# Patient Record
Sex: Female | Born: 1970 | Race: White | Hispanic: No | Marital: Single | Smoking: Current every day smoker
Health system: Southern US, Community
[De-identification: ages and names within clinical notes are randomized; demographics above are authoritative.]

## PROBLEM LIST (undated history)

## (undated) DIAGNOSIS — I10 Essential (primary) hypertension: Secondary | ICD-10-CM

---

## 2005-05-18 ENCOUNTER — Ambulatory Visit: Payer: Self-pay | Admitting: Unknown Physician Specialty

## 2009-02-07 ENCOUNTER — Ambulatory Visit: Payer: Self-pay | Admitting: General Practice

## 2010-07-13 ENCOUNTER — Ambulatory Visit: Payer: Self-pay | Admitting: Unknown Physician Specialty

## 2010-10-12 ENCOUNTER — Ambulatory Visit: Payer: Self-pay | Admitting: Family Medicine

## 2010-10-19 ENCOUNTER — Ambulatory Visit: Payer: Self-pay | Admitting: Family Medicine

## 2011-03-16 ENCOUNTER — Ambulatory Visit: Payer: Self-pay

## 2011-10-25 ENCOUNTER — Ambulatory Visit: Payer: Self-pay | Admitting: Obstetrics and Gynecology

## 2011-12-10 ENCOUNTER — Ambulatory Visit: Payer: Self-pay | Admitting: Medical

## 2012-10-05 ENCOUNTER — Ambulatory Visit: Payer: Self-pay | Admitting: Emergency Medicine

## 2012-10-29 ENCOUNTER — Ambulatory Visit: Payer: Self-pay | Admitting: Obstetrics and Gynecology

## 2012-10-30 ENCOUNTER — Ambulatory Visit: Payer: Self-pay | Admitting: Obstetrics and Gynecology

## 2013-11-06 ENCOUNTER — Ambulatory Visit: Payer: Self-pay | Admitting: Nurse Practitioner

## 2014-10-07 ENCOUNTER — Ambulatory Visit: Payer: Self-pay | Admitting: Surgery

## 2015-02-04 ENCOUNTER — Other Ambulatory Visit: Payer: Self-pay | Admitting: Nurse Practitioner

## 2015-02-04 DIAGNOSIS — Z1231 Encounter for screening mammogram for malignant neoplasm of breast: Secondary | ICD-10-CM

## 2015-02-09 ENCOUNTER — Ambulatory Visit
Admission: RE | Admit: 2015-02-09 | Discharge: 2015-02-09 | Disposition: A | Discharge status: Home / Self Care | Payer: Commercial Managed Care - PPO | Source: Ambulatory Visit | Attending: Nurse Practitioner | Admitting: Nurse Practitioner

## 2015-02-09 DIAGNOSIS — Z1231 Encounter for screening mammogram for malignant neoplasm of breast: Secondary | ICD-10-CM | POA: Diagnosis present

## 2016-02-16 ENCOUNTER — Other Ambulatory Visit: Payer: Self-pay | Admitting: Nurse Practitioner

## 2016-02-17 ENCOUNTER — Other Ambulatory Visit: Payer: Self-pay | Admitting: Nurse Practitioner

## 2016-02-20 ENCOUNTER — Other Ambulatory Visit: Payer: Self-pay | Admitting: Nurse Practitioner

## 2016-02-20 DIAGNOSIS — Z1239 Encounter for other screening for malignant neoplasm of breast: Secondary | ICD-10-CM

## 2016-02-20 DIAGNOSIS — N63 Unspecified lump in unspecified breast: Secondary | ICD-10-CM

## 2016-03-10 ENCOUNTER — Ambulatory Visit
Admission: EM | Admit: 2016-03-10 | Discharge: 2016-03-10 | Disposition: A | Discharge status: Home / Self Care | Payer: Commercial Managed Care - PPO | Attending: Family Medicine | Admitting: Family Medicine

## 2016-03-10 ENCOUNTER — Encounter: Payer: Self-pay | Admitting: Emergency Medicine

## 2016-03-10 DIAGNOSIS — J0100 Acute maxillary sinusitis, unspecified: Secondary | ICD-10-CM

## 2016-03-10 HISTORY — DX: Essential (primary) hypertension: I10

## 2016-03-10 MED ORDER — AMOXICILLIN-POT CLAVULANATE 875-125 MG PO TABS: 1.0000 | ORAL_TABLET | Freq: Two times a day (BID) | ORAL | 0 refills | Status: AC

## 2016-03-10 NOTE — ED Provider Notes (Signed)
[facility] ____________________________________________  Time seen: Approximately 10:43 AM  I have reviewed the triage vital signs and the nursing notes.   HISTORY  Chief Complaint Ear Fullness and Nasal Congestion   HPI Terri Jackson is a 45 y.o. female  presenting for the complaint of 1.5 weeks of runny nose, nasal congestion and sinus pressure. Patient reports the last 2 days both for ears have felt congested and full of fluid. Denies any tinnitus or ear drainage. Denies any fevers. Patient reports symptoms initially started out like she is developing a cold but have progressed. Patient reports that she is having continued pressure around her cheek and forehead. Reports symptoms have been unresolved with over-the-counter cough and congestion medications. Reports frequently blowing nose and getting very thick greenish nasal drainage, as well as postnasal drainage. States occasional cough.  Denies fevers, chest pain or shortness breath, abdominal pain, dysuria, neck pain, back pain, extremity pain or shortness on. Reports continues to eat and drink well. Denies recent sickness or recent antibiotic use.  Patient's last menstrual period was 02/25/2016 (approximate). Denies pregnancy. Terri Jackson, Terri KATHRYN, NP: Pcp    Past Medical History:  Diagnosis Date  . Hypertension     There are no active problems to display for this patient.   History reviewed. No pertinent surgical history.  Current Outpatient Rx  . Order #: [id number] Class: Historical Med  . Order #: [id number] Class: Historical Med  . Order #: [id number] Class: Historical Med  . Order #: [id number] Class: Normal    No current facility-administered medications for this encounter.   Current Outpatient Prescriptions:  .  amLODipine (NORVASC) 5 MG tablet, Take 5 mg by mouth daily., Disp: , Rfl:  .  norethindrone-ethinyl estradiol 1/35 (ORTHO-NOVUM, NORTREL,CYCLAFEM) tablet, Take 1 tablet by mouth daily., Disp:  , Rfl:  .  sertraline (ZOLOFT) 100 MG tablet, Take 100 mg by mouth daily., Disp: , Rfl:  .  amoxicillin-clavulanate (AUGMENTIN) 875-125 MG tablet, Take 1 tablet by mouth every 12 (twelve) hours., Disp: 20 tablet, Rfl: 0  Allergies Patient has no known allergies.  Family History  Problem Relation Age of Onset  . Breast cancer Mother [id number]  . Breast cancer Sister 6845    Social History Social History  Substance Use Topics  . Smoking status: Current Every Day Smoker  . Smokeless tobacco: Never Used  . Alcohol use Yes    Review of Systems Constitutional: No fever/chills Eyes: No visual changes. ENT: No sore throat.As above.  Cardiovascular: Denies chest pain. Respiratory: Denies shortness of breath. Gastrointestinal: No abdominal pain.  No nausea, no vomiting.  No diarrhea.  No constipation. Genitourinary: Negative for dysuria. Musculoskeletal: Negative for back pain. Skin: Negative for rash. Neurological: Negative for headaches, focal weakness or numbness.  10-point ROS otherwise negative.  ____________________________________________   PHYSICAL EXAM:  VITAL SIGNS: ED Triage Vitals  Enc Vitals Group     BP 03/10/16 1016 (!) 148/96     Pulse Rate 03/10/16 1016 97     Resp 03/10/16 1016 16     Temp 03/10/16 1016 99.4 F (37.4 C)     Temp Source 03/10/16 1016 Oral     SpO2 03/10/16 1016 99 %     Weight 03/10/16 1016 155 lb (70.3 kg)     Height 03/10/16 1016 5\' 3"  (1.6 m)     Head Circumference --      Peak Flow --      Pain Score 03/10/16 1020 5     Pain Loc --  Pain Edu? --      Excl. in GC? --   Reports forgot to take home BP med this am, and will take once home.   Constitutional: Alert and oriented. Well appearing and in no acute distress. Eyes: Conjunctivae are normal. PERRL. EOMI. Head: Atraumatic.Mild tenderness to palpation bilateral frontal and maxillary sinuses. No swelling. No erythema.   Ears: no erythema, normal TMs bilaterally.   Nose: nasal  congestion with bilateral nasal turbinate erythema and edema.    Mouth/Throat: Mucous membranes are moist.  Oropharynx non-erythematous.No tonsillar swelling or exudate.  Neck: No stridor.  No cervical spine tenderness to palpation. Hematological/Lymphatic/Immunilogical: No cervical lymphadenopathy. Cardiovascular: Normal rate, regular rhythm. Grossly normal heart sounds.  Good peripheral circulation. Respiratory: Normal respiratory effort.  No retractions. Lungs CTAB. No wheezes, rales or rhonchi. Good air movement.  Gastrointestinal: Soft and nontender. No distention.  Musculoskeletal: No lower or upper extremity tenderness nor edema. No cervical, thoracic or lumbar tenderness to palpation.  Neurologic:  Normal speech and language. No gross focal neurologic deficits are appreciated. No gait instability. Skin:  Skin is warm, dry and intact. No rash noted. Psychiatric: Mood and affect are normal. Speech and behavior are normal.  ___________________________________________   LABS (all labs ordered are listed, but only abnormal results are displayed)  Labs Reviewed - No data to display ____________________________________________   PROCEDURES Procedures    INITIAL IMPRESSION / ASSESSMENT AND PLAN / ED COURSE  Pertinent labs & imaging results that were available during my care of the patient were reviewed by me and considered in my medical decision making (see chart for details).  Well-appearing patient. No acute distress. Suspect frontal and maxillary sinusitis. Will treat patient with oral Augmentin. Encouraged supportive care. Encourage rest, fluids and PCP follow-up as needed. Discussed indication, risks and benefits of medications with patient.  Discussed follow up with Primary care physician this week. Discussed follow up and return parameters including no resolution or any worsening concerns. Patient verbalized understanding and agreed to plan.    ____________________________________________   FINAL CLINICAL IMPRESSION(S) / ED DIAGNOSES  Final diagnoses:  Acute maxillary sinusitis, recurrence not specified     Discharge Medication List as of 03/10/2016 10:37 AM    START taking these medications   Details  amoxicillin-clavulanate (AUGMENTIN) 875-125 MG tablet Take 1 tablet by mouth every 12 (twelve) hours., Starting Sat 03/10/2016, Normal        Note: This dictation was prepared with Dragon dictation along with smaller phrase technology. Any transcriptional errors that result from this process are unintentional.    Clinical Course       Renford DillsLindsey Jamyrah Saur, NP 03/10/16 1111    Renford DillsLindsey Tige Meas, NP 03/10/16 1112

## 2016-03-10 NOTE — Discharge Instructions (Signed)
Take medication as prescribed. Rest. Drink plenty of fluids.  ° °Follow up with your primary care physician this week as needed. Return to Urgent care for new or worsening concerns.  ° °

## 2016-03-10 NOTE — ED Triage Notes (Signed)
Patient c/o cold symptoms for over a week. Patient c/o fullness in left ear that started over 2 days ago. Patient denies fevers.

## 2016-03-13 ENCOUNTER — Telehealth: Payer: Self-pay

## 2016-03-13 NOTE — Telephone Encounter (Signed)
Tired to do a follow up call for patient improvement, no answer or voicemail as an option. Will try again later.

## 2016-03-17 ENCOUNTER — Telehealth: Payer: Self-pay

## 2016-03-21 ENCOUNTER — Ambulatory Visit
Admission: RE | Admit: 2016-03-21 | Discharge: 2016-03-21 | Disposition: A | Discharge status: Home / Self Care | Payer: Commercial Managed Care - PPO | Source: Ambulatory Visit | Attending: Nurse Practitioner | Admitting: Nurse Practitioner

## 2016-03-21 DIAGNOSIS — Z1231 Encounter for screening mammogram for malignant neoplasm of breast: Secondary | ICD-10-CM | POA: Insufficient documentation

## 2016-03-21 DIAGNOSIS — N63 Unspecified lump in unspecified breast: Secondary | ICD-10-CM | POA: Insufficient documentation

## 2016-03-21 DIAGNOSIS — Z1239 Encounter for other screening for malignant neoplasm of breast: Secondary | ICD-10-CM

## 2017-02-15 ENCOUNTER — Other Ambulatory Visit: Payer: Self-pay | Admitting: Nurse Practitioner

## 2017-02-15 DIAGNOSIS — Z1231 Encounter for screening mammogram for malignant neoplasm of breast: Secondary | ICD-10-CM

## 2017-03-25 ENCOUNTER — Ambulatory Visit
Admission: RE | Admit: 2017-03-25 | Discharge: 2017-03-25 | Disposition: A | Discharge status: Home / Self Care | Payer: Commercial Managed Care - PPO | Source: Ambulatory Visit | Attending: Nurse Practitioner | Admitting: Nurse Practitioner

## 2017-03-25 DIAGNOSIS — Z1231 Encounter for screening mammogram for malignant neoplasm of breast: Secondary | ICD-10-CM | POA: Diagnosis not present

## 2018-02-17 ENCOUNTER — Other Ambulatory Visit: Payer: Self-pay | Admitting: Nurse Practitioner

## 2018-02-17 DIAGNOSIS — Z1231 Encounter for screening mammogram for malignant neoplasm of breast: Secondary | ICD-10-CM

## 2018-03-26 ENCOUNTER — Ambulatory Visit
Admission: RE | Admit: 2018-03-26 | Discharge: 2018-03-26 | Disposition: A | Discharge status: Home / Self Care | Payer: PRIVATE HEALTH INSURANCE | Source: Ambulatory Visit | Attending: Nurse Practitioner | Admitting: Nurse Practitioner

## 2018-03-26 ENCOUNTER — Encounter (INDEPENDENT_AMBULATORY_CARE_PROVIDER_SITE_OTHER): Payer: Self-pay

## 2018-03-26 DIAGNOSIS — Z1231 Encounter for screening mammogram for malignant neoplasm of breast: Secondary | ICD-10-CM | POA: Diagnosis not present

## 2019-02-23 ENCOUNTER — Other Ambulatory Visit: Payer: Self-pay | Admitting: Nurse Practitioner

## 2019-02-23 DIAGNOSIS — Z1231 Encounter for screening mammogram for malignant neoplasm of breast: Secondary | ICD-10-CM

## 2019-03-30 ENCOUNTER — Ambulatory Visit
Admission: RE | Admit: 2019-03-30 | Discharge: 2019-03-30 | Disposition: A | Discharge status: Home / Self Care | Payer: Commercial Managed Care - PPO | Source: Ambulatory Visit | Attending: Nurse Practitioner | Admitting: Nurse Practitioner

## 2019-03-30 ENCOUNTER — Other Ambulatory Visit: Payer: Self-pay

## 2019-03-30 DIAGNOSIS — Z1231 Encounter for screening mammogram for malignant neoplasm of breast: Secondary | ICD-10-CM | POA: Insufficient documentation

## 2020-02-25 ENCOUNTER — Other Ambulatory Visit: Payer: Self-pay | Admitting: Nurse Practitioner

## 2020-02-25 DIAGNOSIS — Z1231 Encounter for screening mammogram for malignant neoplasm of breast: Secondary | ICD-10-CM

## 2020-03-30 ENCOUNTER — Other Ambulatory Visit: Payer: Self-pay

## 2020-03-30 ENCOUNTER — Ambulatory Visit
Admission: RE | Admit: 2020-03-30 | Discharge: 2020-03-30 | Disposition: A | Discharge status: Home / Self Care | Payer: Commercial Managed Care - PPO | Source: Ambulatory Visit | Attending: Nurse Practitioner | Admitting: Nurse Practitioner

## 2020-03-30 DIAGNOSIS — Z1231 Encounter for screening mammogram for malignant neoplasm of breast: Secondary | ICD-10-CM | POA: Diagnosis present

## 2021-03-06 ENCOUNTER — Other Ambulatory Visit: Payer: Self-pay | Admitting: Nurse Practitioner

## 2021-03-06 DIAGNOSIS — Z1231 Encounter for screening mammogram for malignant neoplasm of breast: Secondary | ICD-10-CM

## 2021-04-05 ENCOUNTER — Ambulatory Visit
Admission: RE | Admit: 2021-04-05 | Discharge: 2021-04-05 | Disposition: A | Discharge status: Home / Self Care | Payer: Commercial Managed Care - PPO | Source: Ambulatory Visit | Attending: Nurse Practitioner | Admitting: Nurse Practitioner

## 2021-04-05 ENCOUNTER — Other Ambulatory Visit: Payer: Self-pay

## 2021-04-05 DIAGNOSIS — Z1231 Encounter for screening mammogram for malignant neoplasm of breast: Secondary | ICD-10-CM | POA: Insufficient documentation

## 2022-03-22 ENCOUNTER — Other Ambulatory Visit: Payer: Self-pay | Admitting: Nurse Practitioner

## 2022-03-22 DIAGNOSIS — Z1231 Encounter for screening mammogram for malignant neoplasm of breast: Secondary | ICD-10-CM

## 2022-04-10 ENCOUNTER — Ambulatory Visit
Admission: RE | Admit: 2022-04-10 | Discharge: 2022-04-10 | Disposition: A | Discharge status: Home / Self Care | Payer: Commercial Managed Care - PPO | Source: Ambulatory Visit | Attending: Nurse Practitioner | Admitting: Nurse Practitioner

## 2022-04-10 DIAGNOSIS — Z1231 Encounter for screening mammogram for malignant neoplasm of breast: Secondary | ICD-10-CM | POA: Insufficient documentation

## 2022-04-16 IMAGING — MG MM DIGITAL SCREENING BILAT W/ TOMO AND CAD
8 series · 8 of 24 positions shown · non-contrast
Comparison: Previous exam(s).

CLINICAL DATA: Screening.

EXAM:
DIGITAL SCREENING BILATERAL MAMMOGRAM WITH TOMOSYNTHESIS AND CAD
TECHNIQUE: Bilateral screening digital craniocaudal and mediolateral oblique
mammograms were obtained. Bilateral screening digital breast
tomosynthesis was performed. The images were evaluated with
computer-aided detection.

[R MLO synth-2D]
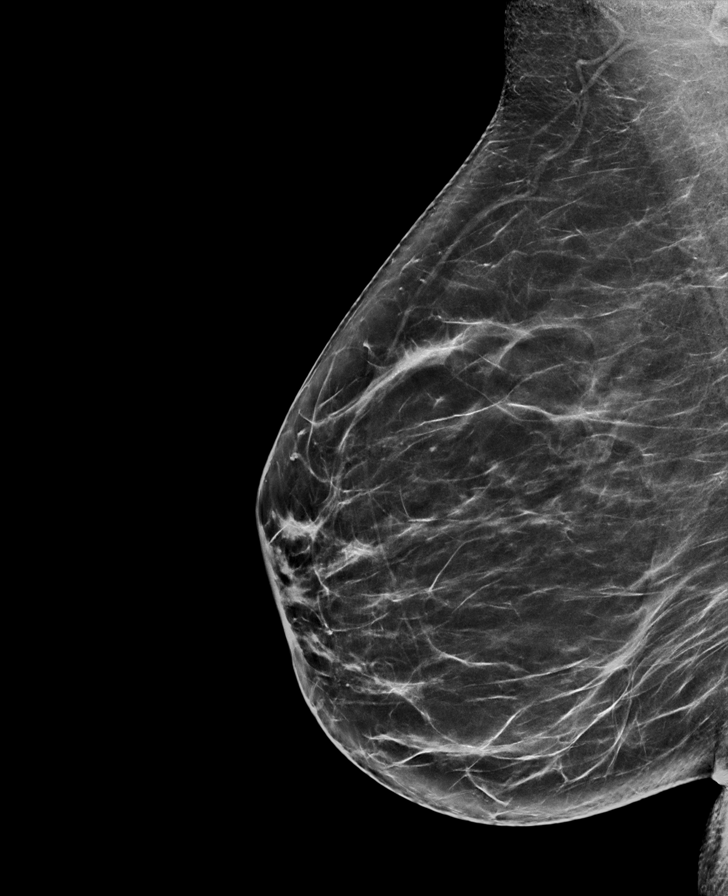

[L MLO synth-2D]
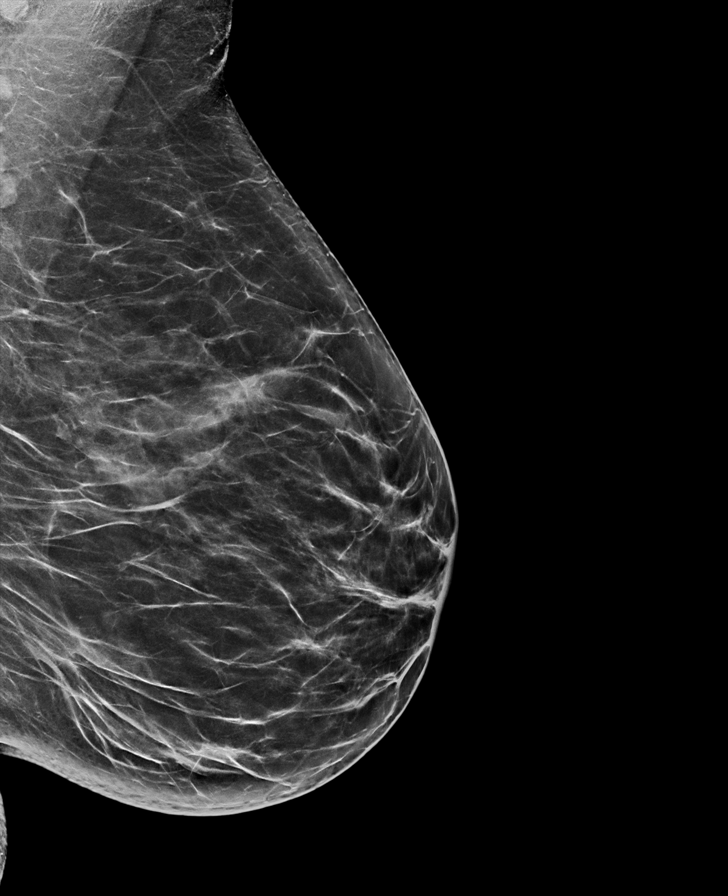

[R CC synth-2D]
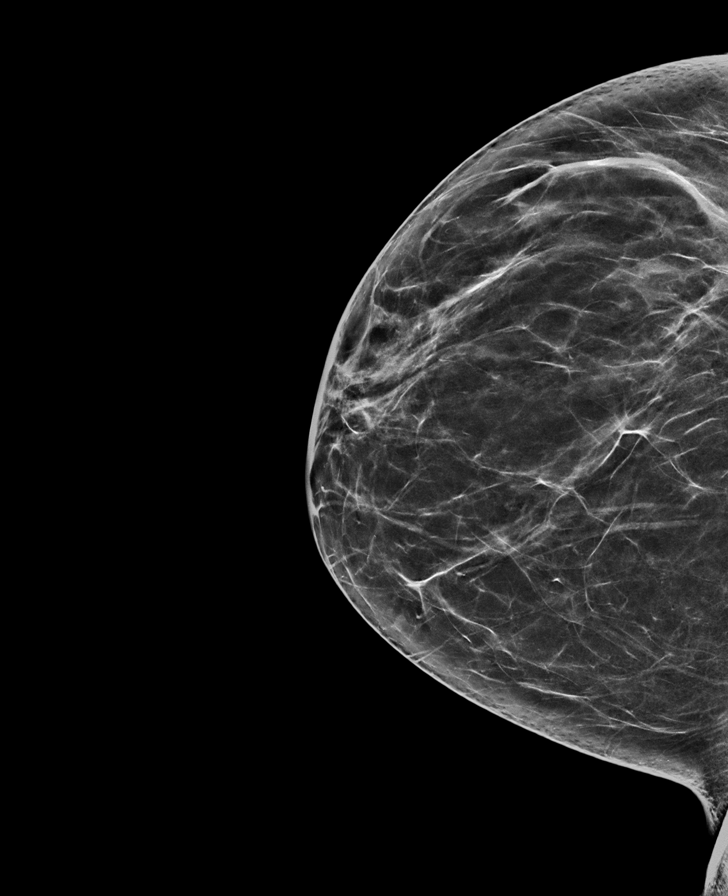

[L CC synth-2D]
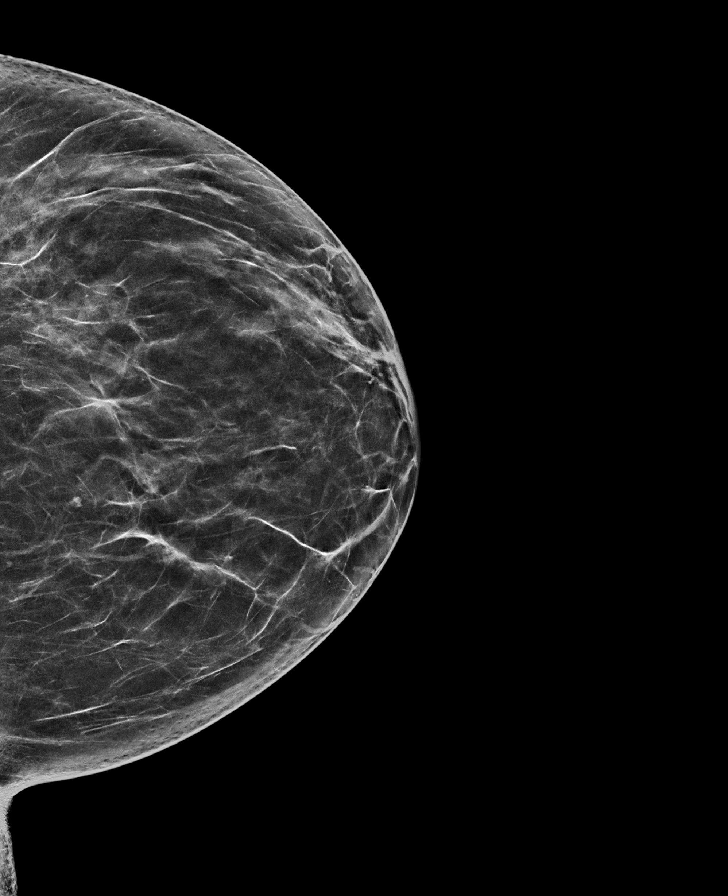

[R MLO tomo · tomo slice 39/76.0]
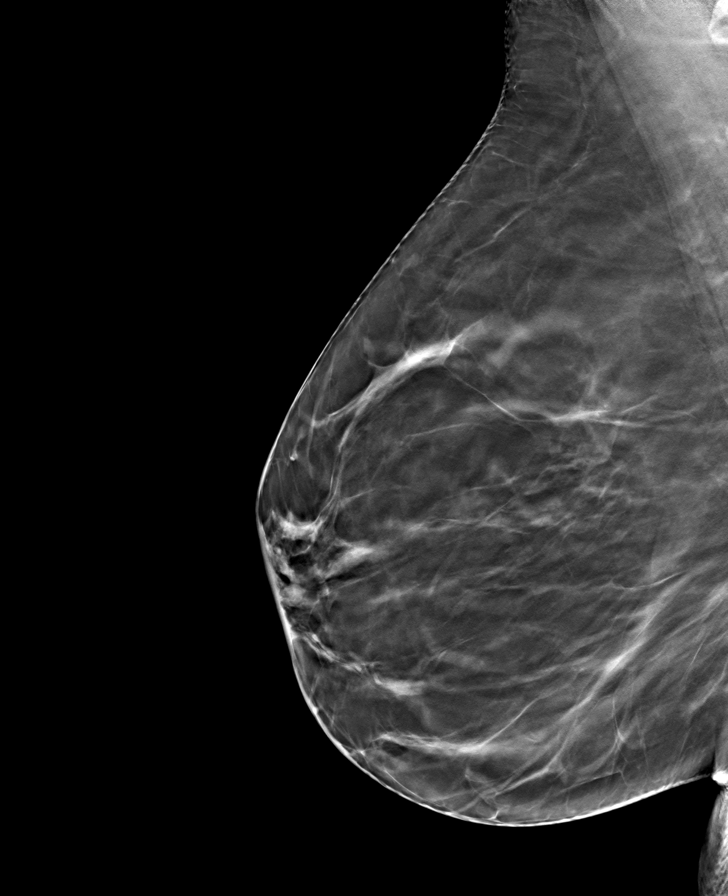

[R CC tomo · tomo slice 35/70.0]
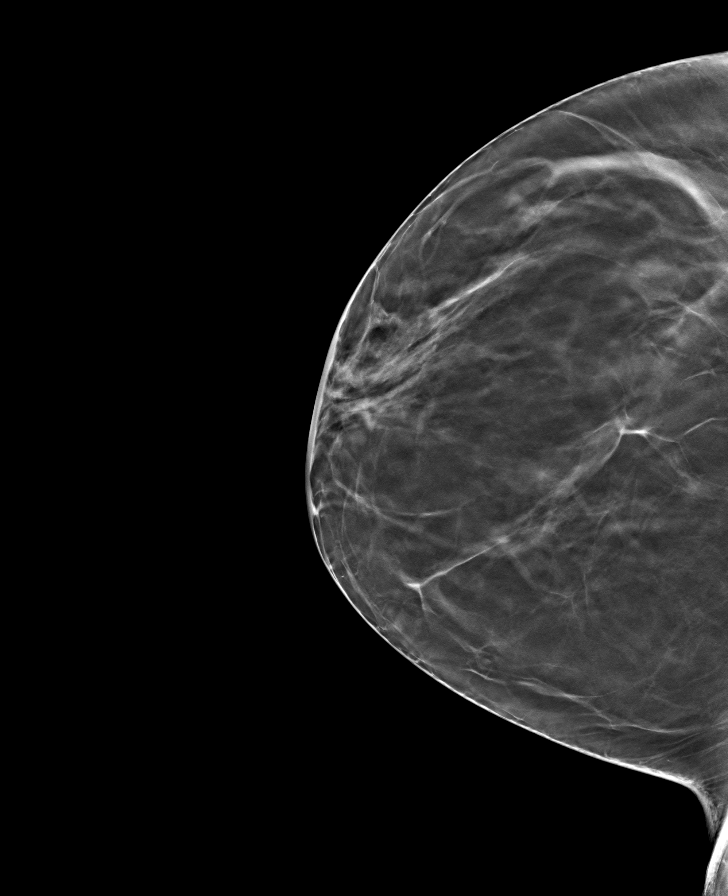

[L MLO tomo · tomo slice 38/75.0]
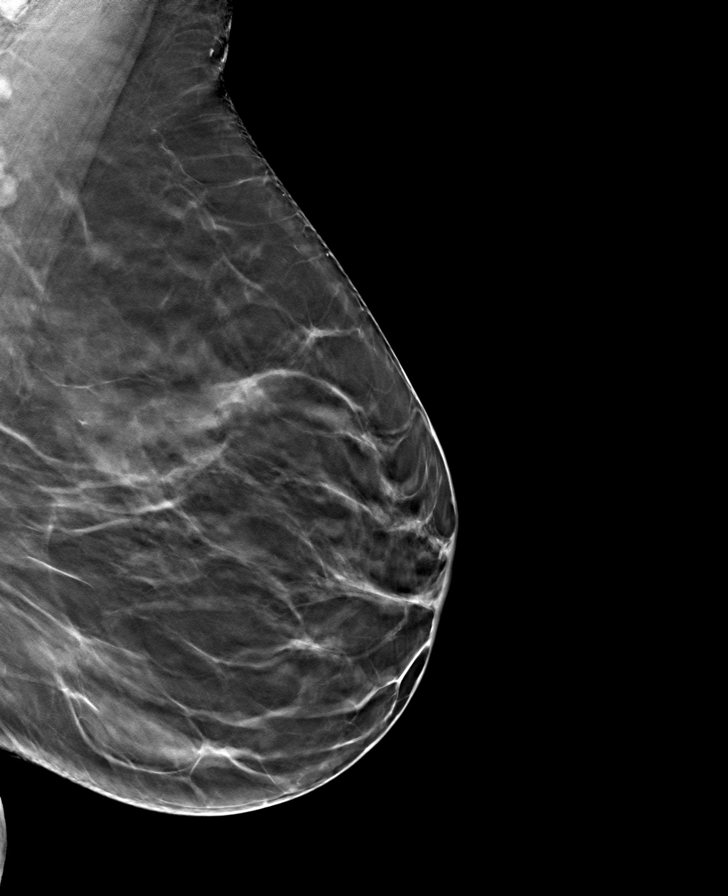

[L CC tomo · tomo slice 35/70.0]
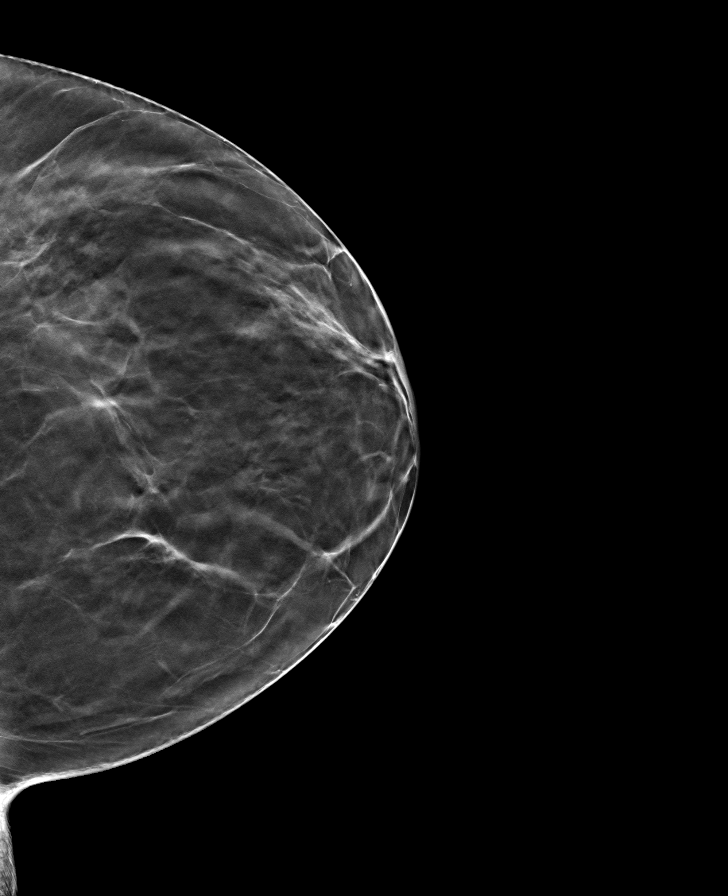

[8 of 24 positions shown; findings below may reference images not displayed]

ACR Breast Density Category b: There are scattered areas of
fibroglandular density.
FINDINGS: There are no findings suspicious for malignancy.
IMPRESSION: No mammographic evidence of malignancy. A result letter of this
screening mammogram will be mailed directly to the patient.

RECOMMENDATION:
Screening mammogram in one year. (Code:51-O-LD2)

BI-RADS CATEGORY  1: Negative.

## 2023-03-11 ENCOUNTER — Other Ambulatory Visit: Payer: Self-pay | Admitting: Nurse Practitioner

## 2023-03-11 DIAGNOSIS — Z1231 Encounter for screening mammogram for malignant neoplasm of breast: Secondary | ICD-10-CM

## 2023-04-15 ENCOUNTER — Ambulatory Visit
Admission: RE | Admit: 2023-04-15 | Discharge: 2023-04-15 | Disposition: A | Discharge status: Home / Self Care | Payer: Commercial Managed Care - PPO | Source: Ambulatory Visit | Attending: Nurse Practitioner | Admitting: Nurse Practitioner

## 2023-04-15 DIAGNOSIS — Z1231 Encounter for screening mammogram for malignant neoplasm of breast: Secondary | ICD-10-CM | POA: Diagnosis present

## 2024-03-17 ENCOUNTER — Other Ambulatory Visit: Payer: Self-pay | Admitting: Nurse Practitioner

## 2024-03-17 DIAGNOSIS — Z1231 Encounter for screening mammogram for malignant neoplasm of breast: Secondary | ICD-10-CM

## 2024-04-21 ENCOUNTER — Ambulatory Visit
Admission: RE | Admit: 2024-04-21 | Discharge: 2024-04-21 | Disposition: A | Discharge status: Home / Self Care | Source: Ambulatory Visit | Attending: Nurse Practitioner | Admitting: Nurse Practitioner

## 2024-04-21 DIAGNOSIS — Z1231 Encounter for screening mammogram for malignant neoplasm of breast: Secondary | ICD-10-CM | POA: Diagnosis present
